# Patient Record
Sex: Female | Born: 1965 | Race: White | Hispanic: No | Marital: Married | Smoking: Never smoker
Health system: Southern US, Community
[De-identification: ages and names within clinical notes are randomized; demographics above are authoritative.]

## PROBLEM LIST (undated history)

## (undated) DIAGNOSIS — F419 Anxiety disorder, unspecified: Secondary | ICD-10-CM

## (undated) DIAGNOSIS — K649 Unspecified hemorrhoids: Secondary | ICD-10-CM

## (undated) DIAGNOSIS — G4700 Insomnia, unspecified: Secondary | ICD-10-CM

## (undated) HISTORY — DX: Insomnia, unspecified: G47.00

## (undated) HISTORY — PX: LAPAROSCOPIC GASTRIC SLEEVE RESECTION: SHX5895

## (undated) HISTORY — DX: Unspecified hemorrhoids: K64.9

## (undated) HISTORY — DX: Anxiety disorder, unspecified: F41.9

---

## 1998-04-02 ENCOUNTER — Other Ambulatory Visit: Admission: RE | Admit: 1998-04-02 | Discharge: 1998-04-02 | Payer: Self-pay | Admitting: Obstetrics and Gynecology

## 1999-04-14 ENCOUNTER — Other Ambulatory Visit: Admission: RE | Admit: 1999-04-14 | Discharge: 1999-04-14 | Payer: Self-pay | Admitting: Obstetrics and Gynecology

## 2000-04-11 ENCOUNTER — Other Ambulatory Visit: Admission: RE | Admit: 2000-04-11 | Discharge: 2000-04-11 | Payer: Self-pay | Admitting: Obstetrics and Gynecology

## 2000-05-09 ENCOUNTER — Other Ambulatory Visit: Admission: RE | Admit: 2000-05-09 | Discharge: 2000-05-09 | Payer: Self-pay | Admitting: Orthopedic Surgery

## 2000-12-02 ENCOUNTER — Ambulatory Visit (HOSPITAL_COMMUNITY): Admission: RE | Admit: 2000-12-02 | Discharge: 2000-12-02 | Payer: Self-pay | Admitting: Gastroenterology

## 2001-05-02 ENCOUNTER — Other Ambulatory Visit: Admission: RE | Admit: 2001-05-02 | Discharge: 2001-05-02 | Payer: Self-pay | Admitting: Obstetrics and Gynecology

## 2001-10-12 ENCOUNTER — Inpatient Hospital Stay (HOSPITAL_COMMUNITY): Admission: AD | Admit: 2001-10-12 | Discharge: 2001-10-12 | Payer: Self-pay | Admitting: Obstetrics and Gynecology

## 2001-12-05 ENCOUNTER — Encounter: Admission: RE | Admit: 2001-12-05 | Discharge: 2001-12-05 | Payer: Self-pay | Admitting: Obstetrics and Gynecology

## 2001-12-14 ENCOUNTER — Inpatient Hospital Stay (HOSPITAL_COMMUNITY): Admission: AD | Admit: 2001-12-14 | Discharge: 2001-12-14 | Payer: Self-pay | Admitting: Obstetrics & Gynecology

## 2001-12-18 ENCOUNTER — Encounter (HOSPITAL_COMMUNITY): Admission: RE | Admit: 2001-12-18 | Discharge: 2002-01-05 | Payer: Self-pay | Admitting: Obstetrics and Gynecology

## 2002-01-01 ENCOUNTER — Encounter: Payer: Self-pay | Admitting: Obstetrics and Gynecology

## 2002-01-03 ENCOUNTER — Inpatient Hospital Stay (HOSPITAL_COMMUNITY): Admission: AD | Admit: 2002-01-03 | Discharge: 2002-01-03 | Payer: Self-pay | Admitting: Obstetrics & Gynecology

## 2002-01-08 ENCOUNTER — Inpatient Hospital Stay (HOSPITAL_COMMUNITY): Admission: AD | Admit: 2002-01-08 | Discharge: 2002-01-11 | Payer: Self-pay | Admitting: Obstetrics & Gynecology

## 2002-02-16 ENCOUNTER — Other Ambulatory Visit: Admission: RE | Admit: 2002-02-16 | Discharge: 2002-02-16 | Payer: Self-pay | Admitting: Obstetrics and Gynecology

## 2003-03-01 ENCOUNTER — Other Ambulatory Visit: Admission: RE | Admit: 2003-03-01 | Discharge: 2003-03-01 | Payer: Self-pay | Admitting: Obstetrics and Gynecology

## 2004-03-10 ENCOUNTER — Other Ambulatory Visit: Admission: RE | Admit: 2004-03-10 | Discharge: 2004-03-10 | Payer: Self-pay | Admitting: Obstetrics and Gynecology

## 2005-05-14 ENCOUNTER — Other Ambulatory Visit: Admission: RE | Admit: 2005-05-14 | Discharge: 2005-05-14 | Payer: Self-pay | Admitting: Obstetrics and Gynecology

## 2010-01-07 ENCOUNTER — Encounter: Admission: RE | Admit: 2010-01-07 | Discharge: 2010-01-07 | Payer: Self-pay | Admitting: Obstetrics and Gynecology

## 2010-07-19 ENCOUNTER — Encounter: Payer: Self-pay | Admitting: Obstetrics and Gynecology

## 2016-05-24 ENCOUNTER — Emergency Department (HOSPITAL_COMMUNITY): Payer: BLUE CROSS/BLUE SHIELD

## 2016-05-24 ENCOUNTER — Encounter (HOSPITAL_COMMUNITY): Payer: Self-pay | Admitting: Emergency Medicine

## 2016-05-24 ENCOUNTER — Emergency Department (HOSPITAL_COMMUNITY)
Admission: EM | Admit: 2016-05-24 | Discharge: 2016-05-25 | Disposition: A | Discharge status: Home / Self Care | Payer: BLUE CROSS/BLUE SHIELD | Attending: Emergency Medicine | Admitting: Emergency Medicine

## 2016-05-24 DIAGNOSIS — Y999 Unspecified external cause status: Secondary | ICD-10-CM | POA: Insufficient documentation

## 2016-05-24 DIAGNOSIS — Y939 Activity, unspecified: Secondary | ICD-10-CM | POA: Insufficient documentation

## 2016-05-24 DIAGNOSIS — R51 Headache: Secondary | ICD-10-CM | POA: Insufficient documentation

## 2016-05-24 DIAGNOSIS — B349 Viral infection, unspecified: Secondary | ICD-10-CM | POA: Diagnosis not present

## 2016-05-24 DIAGNOSIS — S4991XA Unspecified injury of right shoulder and upper arm, initial encounter: Secondary | ICD-10-CM | POA: Insufficient documentation

## 2016-05-24 DIAGNOSIS — Y92410 Unspecified street and highway as the place of occurrence of the external cause: Secondary | ICD-10-CM | POA: Diagnosis not present

## 2016-05-24 DIAGNOSIS — R509 Fever, unspecified: Secondary | ICD-10-CM

## 2016-05-24 LAB — CBC WITH DIFFERENTIAL/PLATELET
Basophils Absolute: 0 10*3/uL (ref 0.0–0.1)
Basophils Relative: 0 %
Eosinophils Absolute: 0 10*3/uL (ref 0.0–0.7)
Eosinophils Relative: 0 %
HCT: 40.8 % (ref 36.0–46.0)
Hemoglobin: 13.8 g/dL (ref 12.0–15.0)
Lymphocytes Relative: 12 %
Lymphs Abs: 0.8 10*3/uL (ref 0.7–4.0)
MCH: 31.2 pg (ref 26.0–34.0)
MCHC: 33.8 g/dL (ref 30.0–36.0)
MCV: 92.3 fL (ref 78.0–100.0)
Monocytes Absolute: 0.4 10*3/uL (ref 0.1–1.0)
Monocytes Relative: 6 %
Neutro Abs: 5.3 10*3/uL (ref 1.7–7.7)
Neutrophils Relative %: 82 %
Platelets: 146 10*3/uL — ABNORMAL LOW (ref 150–400)
RBC: 4.42 MIL/uL (ref 3.87–5.11)
RDW: 12.6 % (ref 11.5–15.5)
WBC: 6.5 10*3/uL (ref 4.0–10.5)

## 2016-05-24 LAB — BASIC METABOLIC PANEL
Anion gap: 8 (ref 5–15)
BUN: 10 mg/dL (ref 6–20)
CO2: 22 mmol/L (ref 22–32)
Calcium: 8.5 mg/dL — ABNORMAL LOW (ref 8.9–10.3)
Chloride: 105 mmol/L (ref 101–111)
Creatinine, Ser: 0.92 mg/dL (ref 0.44–1.00)
GFR calc Af Amer: >60 mL/min (ref >60)
GFR calc non Af Amer: >60 mL/min (ref >60)
Glucose, Bld: 92 mg/dL (ref 65–99)
Potassium: 3.5 mmol/L (ref 3.5–5.1)
Sodium: 135 mmol/L (ref 135–145)

## 2016-05-24 MED ORDER — SODIUM CHLORIDE 0.9 % IV BOLUS (SEPSIS)
1000.0000 mL | Freq: Once | INTRAVENOUS | Status: AC
  Administered 2016-05-24: 1000 mL via INTRAVENOUS

## 2016-05-24 NOTE — ED Notes (Signed)
ED Provider at bedside. 
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
## 2016-05-25 ENCOUNTER — Emergency Department (HOSPITAL_COMMUNITY): Payer: BLUE CROSS/BLUE SHIELD

## 2016-05-25 DIAGNOSIS — S4991XA Unspecified injury of right shoulder and upper arm, initial encounter: Secondary | ICD-10-CM | POA: Diagnosis not present

## 2016-05-25 LAB — URINE MICROSCOPIC-ADD ON

## 2016-05-25 LAB — URINALYSIS, ROUTINE W REFLEX MICROSCOPIC
Glucose, UA: NEGATIVE mg/dL
Ketones, ur: 40 mg/dL — AB
Leukocytes, UA: NEGATIVE
Nitrite: NEGATIVE
Protein, ur: 30 mg/dL — AB
Specific Gravity, Urine: 1.026 (ref 1.005–1.030)
pH: 5.5 (ref 5.0–8.0)

## 2016-05-25 MED ORDER — HYDROCODONE-ACETAMINOPHEN 5-325 MG PO TABS: 1.0000 | ORAL_TABLET | Freq: Four times a day (QID) | ORAL | 0 refills | Status: DC | PRN

## 2016-05-25 MED ORDER — SODIUM CHLORIDE 0.9 % IV BOLUS (SEPSIS)
1000.0000 mL | Freq: Once | INTRAVENOUS | Status: AC
  Administered 2016-05-25: 1000 mL via INTRAVENOUS

## 2016-05-25 MED ORDER — GUAIFENESIN ER 1200 MG PO TB12: 1.0000 | ORAL_TABLET | Freq: Two times a day (BID) | ORAL | 0 refills | Status: DC

## 2016-05-25 MED ORDER — IBUPROFEN 800 MG PO TABS: 800.0000 mg | ORAL_TABLET | Freq: Three times a day (TID) | ORAL | 0 refills | Status: DC | PRN

## 2016-05-25 NOTE — Discharge Instructions (Signed)
Your CT scans and x-rays did not show any abnormality.  Increase her fluid intake, rest as much as possible.  Follow-up with the orthopedist provided

## 2016-05-25 NOTE — ED Notes (Signed)
Patient ambulatory to restroom  ?

## 2019-04-25 ENCOUNTER — Other Ambulatory Visit: Payer: Self-pay

## 2019-04-25 DIAGNOSIS — Z20822 Contact with and (suspected) exposure to covid-19: Secondary | ICD-10-CM

## 2019-04-27 LAB — NOVEL CORONAVIRUS, NAA: SARS-CoV-2, NAA: NOT DETECTED

## 2019-11-27 ENCOUNTER — Ambulatory Visit: Payer: BLUE CROSS/BLUE SHIELD | Admitting: Physician Assistant

## 2020-03-19 ENCOUNTER — Other Ambulatory Visit: Payer: Self-pay | Admitting: Family Medicine

## 2020-03-19 DIAGNOSIS — R519 Headache, unspecified: Secondary | ICD-10-CM

## 2020-03-20 ENCOUNTER — Other Ambulatory Visit: Payer: Self-pay | Admitting: Family Medicine

## 2020-03-20 DIAGNOSIS — R1909 Other intra-abdominal and pelvic swelling, mass and lump: Secondary | ICD-10-CM

## 2020-03-24 ENCOUNTER — Other Ambulatory Visit: Payer: Self-pay

## 2020-03-24 ENCOUNTER — Ambulatory Visit
Admission: RE | Admit: 2020-03-24 | Discharge: 2020-03-24 | Disposition: A | Discharge status: Home / Self Care | Payer: 59 | Source: Ambulatory Visit | Attending: Family Medicine | Admitting: Family Medicine

## 2020-03-24 DIAGNOSIS — R1909 Other intra-abdominal and pelvic swelling, mass and lump: Secondary | ICD-10-CM

## 2020-03-24 NOTE — Progress Notes (Signed)
Cardiology Office Note:    Date:  03/26/2020   ID:  Margaret Pruitt, DOB December 29, 1965, MRN [id number]  PCP:  Aliene Beams, MD  [facility] Cardiologist:  No primary care provider on file.  [facility] Electrophysiologist:  None   Referring MD: Aliene Beams, MD    History of Present Illness:    Margaret Pruitt is a 54 y.o. female with a hx of depression who was referred by Dr. Tracie Harrier for evaluation of palpitations.  The patient was seen by PCP on 03/18/20 for follow-up. Note reviewed. Patient has been having intermittent palpitations over the past several weeks. Last episode was about 1 week ago before she was going to bed.   Patient states she has had 3 episodes of heart fluttering. Last was 09/23 where she felt her heart was "beating out of her chest" and she had some tingling in her arms. This occurred at night when she was just relaxing with her husband. Lasted about 1.5 hrs before resolving on it's own. Had some lightheadedness and nausea during the episode. No syncope or SOB. Has had one other episode like this prior but did not last as long.   Otherwise, she feels overall okay. Has been having some fatigue, but denies any exertional chest pain or SOB. No swelling in the legs, no orthopnea or PND.  Family history: Mother has Afib. Father hypertension, DMII, high cholesterol  Labs 2017: Na 135, K 3.5, Cr 0.92, WBC 6.5, HgB 13.8  Labs at PCP 03/18/20: TSH 1.82, A1C 5.4, HgB 13.8, Cr 0.85  Past Medical History:  Diagnosis Date  . Anxiety   . Hemorrhoid   . Insomnia     Past Surgical History:  Procedure Laterality Date  . LAPAROSCOPIC GASTRIC SLEEVE RESECTION      Current Medications: Current Meds  Medication Sig  . escitalopram (LEXAPRO) 10 MG tablet Take 10 mg by mouth daily.  Marland Kitchen estradiol (ESTRACE) 2 MG tablet Take 2 mg by mouth daily.  . progesterone (PROMETRIUM) 100 MG capsule progesterone micronized 100 mg capsule  . zolpidem (AMBIEN) 10 MG tablet Take 5 mg by  mouth at bedtime as needed.      Allergies:   Patient has no known allergies.   Social History   Socioeconomic History  . Marital status: Married    Spouse name: Not on file  . Number of children: Not on file  . Years of education: Not on file  . Highest education level: Not on file  Occupational History  . Not on file  Tobacco Use  . Smoking status: Never Smoker  . Smokeless tobacco: Never Used  Substance and Sexual Activity  . Alcohol use: No  . Drug use: No  . Sexual activity: Not on file  Other Topics Concern  . Not on file  Social History Narrative  . Not on file   Social Determinants of Health   Financial Resource Strain:   . Difficulty of Paying Living Expenses: Not on file  Food Insecurity:   . Worried About Programme researcher, broadcasting/film/video in the Last Year: Not on file  . Ran Out of Food in the Last Year: Not on file  Transportation Needs:   . Lack of Transportation (Medical): Not on file  . Lack of Transportation (Non-Medical): Not on file  Physical Activity:   . Days of Exercise per Week: Not on file  . Minutes of Exercise per Session: Not on file  Stress:   . Feeling of Stress : Not on file  Social Connections:   . Frequency of Communication with Friends and Family: Not on file  . Frequency of Social Gatherings with Friends and Family: Not on file  . Attends Religious Services: Not on file  . Active Member of Clubs or Organizations: Not on file  . Attends BankerClub or Organization Meetings: Not on file  . Marital Status: Not on file     Family History: The patient's family history includes Atrial fibrillation in her mother; Colon cancer in her mother; Diabetes in her father; Hypertension in her father; Stroke in her father.  ROS:   Please see the history of present illness.    The patient denies chest pain, chest pressure, dyspnea at rest or with exertion, PND, orthopnea, or leg swelling. Denies cough, fever, chills. Denies vomiting. Denies syncope. Denies  snoring.  EKGs/Labs/Other Studies Reviewed:    The following studies were reviewed today: No cardiac studies  EKG:  EKG is  ordered today.  The ekg ordered today demonstrates NSR HR 72.  Recent Labs: No results found for requested labs within last 8760 hours.  Recent Lipid Panel No results found for: CHOL, TRIG, HDL, CHOLHDL, VLDL, LDLCALC, LDLDIRECT  Physical Exam:    VS:  BP (!) 90/52   Pulse 72   Ht 5' 3.5" (1.613 m)   Wt 147 lb (66.7 kg)   SpO2 97%   BMI 25.63 kg/m     Wt Readings from Last 3 Encounters:  03/26/20 147 lb (66.7 kg)     GEN:  Well nourished, well developed in no acute distress HEENT: Normal NECK: No JVD; No carotid bruits LYMPHATICS: No lymphadenopathy CARDIAC: RRR, no murmurs, rubs, gallops RESPIRATORY:  Clear to auscultation without rales, wheezing or rhonchi  ABDOMEN: Soft, non-tender, non-distended MUSCULOSKELETAL:  No edema; No deformity  SKIN: Warm and dry NEUROLOGIC:  Alert and oriented x 3 PSYCHIATRIC:  Normal affect   ASSESSMENT:    1. Palpitations    PLAN:    In order of problems listed above:  #Palpitations: Developed sudden onset palpitations while relaxing with her husband at night on 09/23. Unknown HR at the time. Had some associated lightheadedness and nausea. Resolved after 1.5hours after drinking cold water. Had 2 similar episodes but did not last as long. Possible SVT given abrupt onset and stopping after drinking cold drink.  -Plan for zio patch -Pending Zio patch findings, consider TTE -Will give metop 12.5mg  BID as needed for treatment of palpitations if they are to recur at home -Minimize caffeine, maintain good hydration -TSH and hemoglobin normal, electrolytes normal -Follow-up in 6 weeks to go over the results   Medication Adjustments/Labs and Tests Ordered: Current medicines are reviewed at length with the patient today.  Concerns regarding medicines are outlined above.  Orders Placed This Encounter  Procedures   . LONG TERM MONITOR (3-14 DAYS)  . EKG 12-Lead   Meds ordered this encounter  Medications  . metoprolol tartrate (LOPRESSOR) 25 MG tablet    Sig: Take 0.5 tablets (12.5 mg total) by mouth 2 (two) times daily as needed (palpitations).    Dispense:  90 tablet    Refill:  3    Patient Instructions  Medication Instructions:  Your physician has recommended you make the following change in your medication:   START: metoprolol tartrate (lopressor) 25 mg tablet: Take 1/2 tablet (12.5 mg) by mouth twice a day AS NEEDED for palpitations  *If you need a refill on your cardiac medications before your next appointment, please call your pharmacy*  Lab Work: None  If you have labs (blood work) drawn today and your tests are completely normal, you will receive your results only by: Marland Kitchen MyChart Message (if you have MyChart) OR . A paper copy in the mail If you have any lab test that is abnormal or we need to change your treatment, we will call you to review the results.   Testing/Procedures: Your physician has recommended that you wear a 14 day monitor. These monitors are medical devices that record the heart's electrical activity. Doctors most often use these monitors to diagnose arrhythmias. Arrhythmias are problems with the speed or rhythm of the heartbeat. The monitor is a small, portable device. You can wear one while you do your normal daily activities. This is usually used to diagnose what is causing palpitations/syncope (passing out).   Follow-Up: Follow up with Dr. Shari Prows on 05/06/20 at 1:20 PM   Other Instructions ZIO XT- Long Term Monitor Instructions   Your physician has requested you wear your ZIO patch monitor 14 days.   This is a single patch monitor.  [employer] supplies one patch monitor per enrollment.  Additional stickers are not available.   Please do not apply patch if you will be having a Nuclear Stress Test, Echocardiogram, Cardiac CT, MRI, or Chest Xray during the  time frame you would be wearing the monitor. The patch cannot be worn during these tests.  You cannot remove and re-apply the ZIO XT patch monitor.   Your ZIO patch monitor will be sent USPS Priority mail from [facility] directly to your home address. The monitor may also be mailed to a PO BOX if home delivery is not available.   It may take 3-5 days to receive your monitor after you have been enrolled.   Once you have received you monitor, please review enclosed instructions.  Your monitor has already been registered assigning a specific monitor serial # to you.   Applying the monitor   Shave hair from upper left chest.   Hold abrader disc by orange tab.  Rub abrader in 40 strokes over left upper chest as indicated in your monitor instructions.   Clean area with 4 enclosed alcohol pads .  Use all pads to assure are is cleaned thoroughly.  Let dry.   Apply patch as indicated in monitor instructions.  Patch will be place under collarbone on left side of chest with arrow pointing upward.   Rub patch adhesive wings for 2 minutes.Remove white label marked "1".  Remove white label marked "2".  Rub patch adhesive wings for 2 additional minutes.   While looking in a mirror, press and release button in center of patch.  A small green light will flash 3-4 times .  This will be your only indicator the monitor has been turned on.     Do not shower for the first 24 hours.  You may shower after the first 24 hours.   Press button if you feel a symptom. You will hear a small click.  Record Date, Time and Symptom in the Patient Log Book.   When you are ready to remove patch, follow instructions on last 2 pages of Patient Log Book.  Stick patch monitor onto last page of Patient Log Book.   Place Patient Log Book in [place] box.  Use locking tab on box and tape box closed securely.  The Orange and [employer] has [employer] on it.  Please place in mailbox as soon as possible.  Your  physician should  have your test results approximately 7 days after the monitor has been mailed back to [facility].   Call [facility] Customer Care at [phone] if you have questions regarding your ZIO XT patch monitor.  Call them immediately if you see an orange light blinking on your monitor.   If your monitor falls off in less than 4 days contact our Monitor department at [phone].  If your monitor becomes loose or falls off after 4 days call [employer] at [phone] for suggestions on securing your monitor.       Signed, Meriam Sprague, MD  03/26/2020 2:59 PM    [facility]

## 2020-03-26 ENCOUNTER — Other Ambulatory Visit: Payer: Self-pay

## 2020-03-26 ENCOUNTER — Ambulatory Visit (INDEPENDENT_AMBULATORY_CARE_PROVIDER_SITE_OTHER): Payer: 59 | Admitting: Cardiology

## 2020-03-26 ENCOUNTER — Encounter: Payer: Self-pay | Admitting: Cardiology

## 2020-03-26 VITALS — BP 90/52 | HR 72 | Ht 63.5 in | Wt 147.0 lb

## 2020-03-26 DIAGNOSIS — R002 Palpitations: Secondary | ICD-10-CM

## 2020-03-26 MED ORDER — METOPROLOL TARTRATE 25 MG PO TABS: 12.5000 mg | ORAL_TABLET | Freq: Two times a day (BID) | ORAL | 3 refills | Status: AC | PRN

## 2020-03-26 NOTE — Patient Instructions (Signed)
Medication Instructions:  Your physician has recommended you make the following change in your medication:   START: metoprolol tartrate (lopressor) 25 mg tablet: Take 1/2 tablet (12.5 mg) by mouth twice a day AS NEEDED for palpitations  *If you need a refill on your cardiac medications before your next appointment, please call your pharmacy*   Lab Work: None  If you have labs (blood work) drawn today and your tests are completely normal, you will receive your results only by: Marland Kitchen MyChart Message (if you have MyChart) OR . A paper copy in the mail If you have any lab test that is abnormal or we need to change your treatment, we will call you to review the results.   Testing/Procedures: Your physician has recommended that you wear a 14 day monitor. These monitors are medical devices that record the heart's electrical activity. Doctors most often use these monitors to diagnose arrhythmias. Arrhythmias are problems with the speed or rhythm of the heartbeat. The monitor is a small, portable device. You can wear one while you do your normal daily activities. This is usually used to diagnose what is causing palpitations/syncope (passing out).   Follow-Up: Follow up with Dr. Shari Prows on 05/06/20 at 1:20 PM   Other Instructions ZIO XT- Long Term Monitor Instructions   Your physician has requested you wear your ZIO patch monitor 14 days.   This is a single patch monitor.  [employer] supplies one patch monitor per enrollment.  Additional stickers are not available.   Please do not apply patch if you will be having a Nuclear Stress Test, Echocardiogram, Cardiac CT, MRI, or Chest Xray during the time frame you would be wearing the monitor. The patch cannot be worn during these tests.  You cannot remove and re-apply the ZIO XT patch monitor.   Your ZIO patch monitor will be sent USPS Priority mail from [facility] directly to your home address. The monitor may also be mailed to a PO BOX if  home delivery is not available.   It may take 3-5 days to receive your monitor after you have been enrolled.   Once you have received you monitor, please review enclosed instructions.  Your monitor has already been registered assigning a specific monitor serial # to you.   Applying the monitor   Shave hair from upper left chest.   Hold abrader disc by orange tab.  Rub abrader in 40 strokes over left upper chest as indicated in your monitor instructions.   Clean area with 4 enclosed alcohol pads .  Use all pads to assure are is cleaned thoroughly.  Let dry.   Apply patch as indicated in monitor instructions.  Patch will be place under collarbone on left side of chest with arrow pointing upward.   Rub patch adhesive wings for 2 minutes.Remove white label marked "1".  Remove white label marked "2".  Rub patch adhesive wings for 2 additional minutes.   While looking in a mirror, press and release button in center of patch.  A small green light will flash 3-4 times .  This will be your only indicator the monitor has been turned on.     Do not shower for the first 24 hours.  You may shower after the first 24 hours.   Press button if you feel a symptom. You will hear a small click.  Record Date, Time and Symptom in the Patient Log Book.   When you are ready to remove patch, follow instructions on last 2  pages of Patient Log Book.  Stick patch monitor onto last page of Patient Log Book.   Place Patient Log Book in [place] box.  Use locking tab on box and tape box closed securely.  The [employer] and [employer] has [employer] on it.  Please place in mailbox as soon as possible.  Your physician should have your test results approximately 7 days after the monitor has been mailed back to [facility].   Call [facility] Customer Care at [phone] if you have questions regarding your ZIO XT patch monitor.  Call them immediately if you see an orange light blinking on your monitor.   If your  monitor falls off in less than 4 days contact our Monitor department at [phone].  If your monitor becomes loose or falls off after 4 days call [employer] at [phone] for suggestions on securing your monitor.

## 2020-03-31 ENCOUNTER — Other Ambulatory Visit (INDEPENDENT_AMBULATORY_CARE_PROVIDER_SITE_OTHER): Payer: 59

## 2020-03-31 DIAGNOSIS — R002 Palpitations: Secondary | ICD-10-CM

## 2020-04-15 ENCOUNTER — Ambulatory Visit
Admission: RE | Admit: 2020-04-15 | Discharge: 2020-04-15 | Disposition: A | Discharge status: Home / Self Care | Payer: 59 | Source: Ambulatory Visit | Attending: Family Medicine | Admitting: Family Medicine

## 2020-04-15 DIAGNOSIS — R519 Headache, unspecified: Secondary | ICD-10-CM

## 2020-04-15 MED ORDER — GADOBENATE DIMEGLUMINE 529 MG/ML IV SOLN
13.0000 mL | Freq: Once | INTRAVENOUS | Status: AC | PRN
  Administered 2020-04-15: 13 mL via INTRAVENOUS

## 2020-04-28 ENCOUNTER — Telehealth: Payer: Self-pay | Admitting: Cardiology

## 2020-04-28 NOTE — Telephone Encounter (Signed)
Meriam Sprague, MD  04/28/2020 8:01 AM EDT     Patient's monitor showed very few episodes of SVT but these did not correlate with her triggered events. Otherwise, it was normal. We can continue the metoprolol as needed as this will help calm the palpitations down and monitor for now. If she is bothered by the extra beats and few runs of SVT, we can make it so she takes metoprolol 12.5mg  BID scheduled. It is up to her and we are happy to do what works best. Thanks so much!   The patient has been notified of the result and verbalized understanding.  All questions (if any) were answered. Patient states that she has not needed to take any metoprolol so she would like to keep it on an as needed basis. She will let us know if symptoms become bothersome.  Patient is also wondering if she needs to keep her FU appointment 11/09 since it was to discuss results.

## 2020-04-28 NOTE — Telephone Encounter (Signed)
Patient returning call for heart monitor results. 

## 2020-04-28 NOTE — Telephone Encounter (Signed)
No need to keep 11/9 appointment. She can always call and schedule if something comes up.

## 2020-04-29 NOTE — Telephone Encounter (Signed)
11/9 appointment with Dr. Shari Prows cancelled.

## 2020-04-29 NOTE — Telephone Encounter (Signed)
See MyChart messages. Appointment was cancelled.

## 2020-05-06 ENCOUNTER — Ambulatory Visit: Payer: 59 | Admitting: Cardiology

## 2021-08-29 IMAGING — MR MR HEAD WO/W CM
13 series · 48 of 48 positions shown · IV contrast (13ml multihance)
Comparison: 05/25/2016 head CT.

CLINICAL DATA: Chronic headaches.  Blurred vision.

EXAM:
MRI HEAD WITHOUT AND WITH CONTRAST
TECHNIQUE: Multiplanar, multiecho pulse sequences of the brain and surrounding
structures were obtained without and with intravenous contrast.
CONTRAST:  13mL MULTIHANCE GADOBENATE DIMEGLUMINE 529 MG/ML IV SOLN

[Series 2: T1 · sagittal · 5.0mm · 0.45mm/px · 1 of 21 slices shown]
[im 1/21]
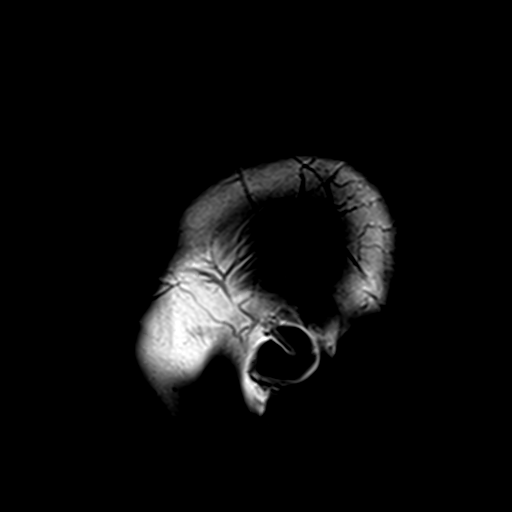

[Series 3: DWI · axial · 3.0mm · 1.80mm/px · z∈[-99,+48]mm · 7 of 99 slices shown (1 of 4)]
[im 1/99]
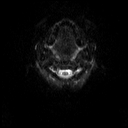
[im 17/99]
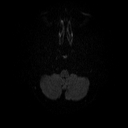
[im 33/99]
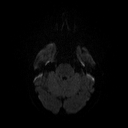
[im 50/99]
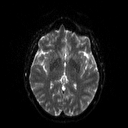
[im 66/99]
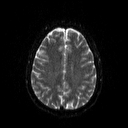
[im 82/99]
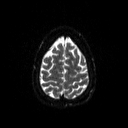
[im 99/99]
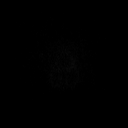

[Series 4: DWI · axial · 3.0mm · 1.80mm/px · z∈[-99,+48]mm · 3 of 50 slices shown (2 of 4)]
[im 1/50]
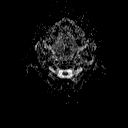
[im 25/50]
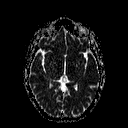
[im 50/50]
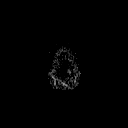

[Series 5: DWI · coronal · 5.0mm · 1.80mm/px · 4 of 66 slices shown (3 of 4)]
[im 1/66]
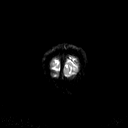
[im 22/66]
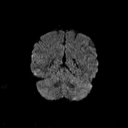
[im 44/66]
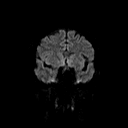
[im 66/66]
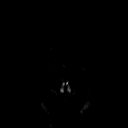

[Series 6: DWI · coronal · 5.0mm · 1.80mm/px · 2 of 34 slices shown (4 of 4)]
[im 1/34]
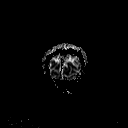
[im 34/34]
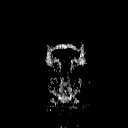

[Series 7: T2 · axial · 5.0mm · 0.72mm/px · 1 of 22 slices shown (1 of 2)]
[im 1/22]
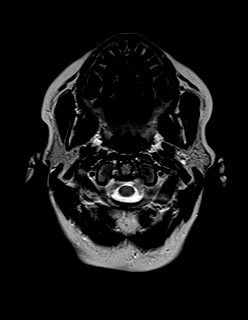

[Series 8: FLAIR · axial · 3.0mm · 0.45mm/px · z∈[-93,+42]mm · 2 of 30 slices shown (1 of 2)]
[im 1/30]
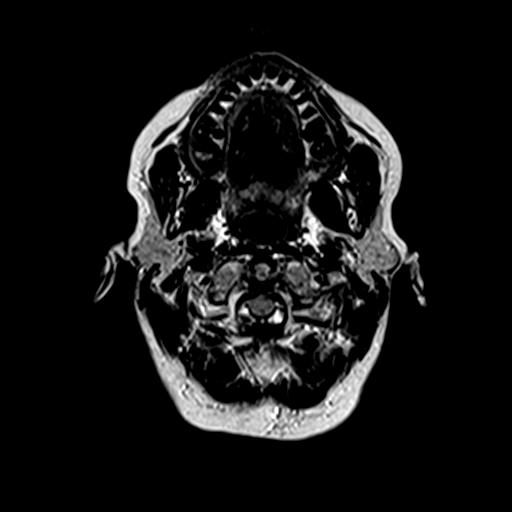
[im 30/30]
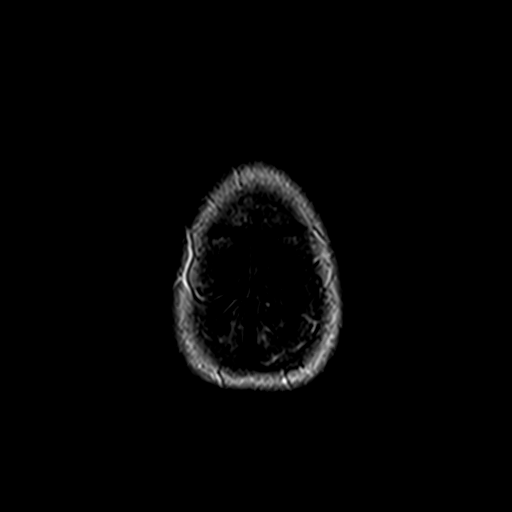

[Series 10: swi_images · axial · 4.0mm · 0.90mm/px · z∈[-96,+44]mm · 2 of 36 slices shown]
[im 1/36]
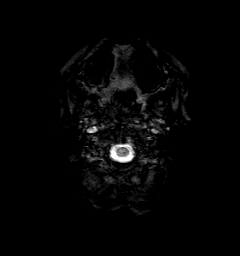
[im 36/36]
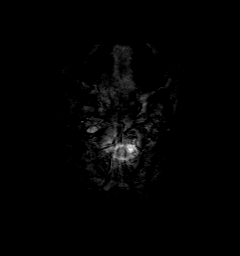

[Series 11: t1_mpr_tra · axial · 1.0mm · 0.75mm/px · z∈[-98,+45]mm · 10 of 144 slices shown (1 of 2)]
[im 1/144]
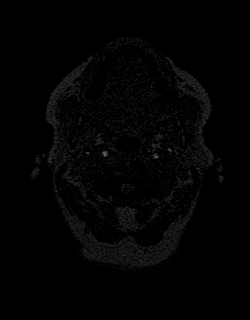
[im 16/144]
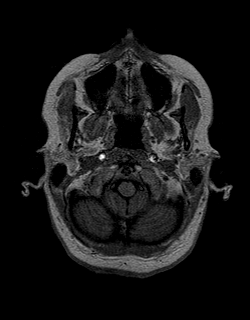
[im 32/144]
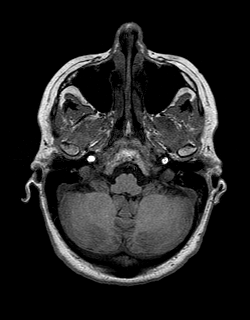
[im 48/144]
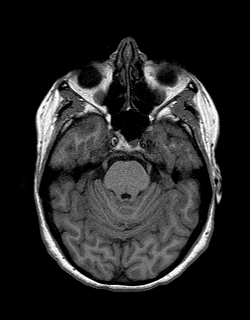
[im 64/144]
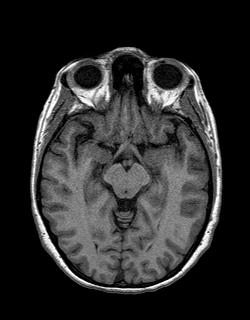
[im 80/144]
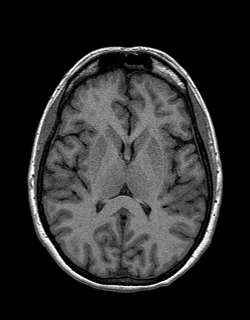
[im 96/144]
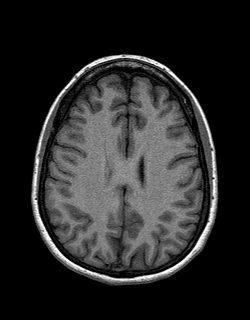
[im 112/144]
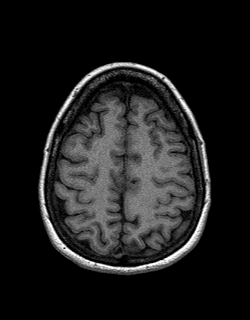
[im 128/144]
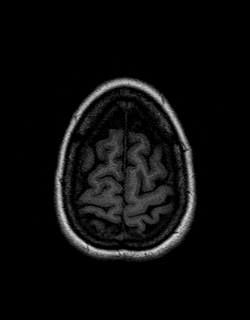
[im 144/144]
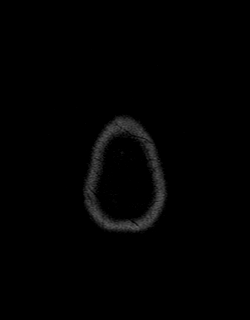

[Series 12: FLAIR · sagittal · 5.0mm · 0.45mm/px · 2 of 25 slices shown (2 of 2)]
[im 1/25]
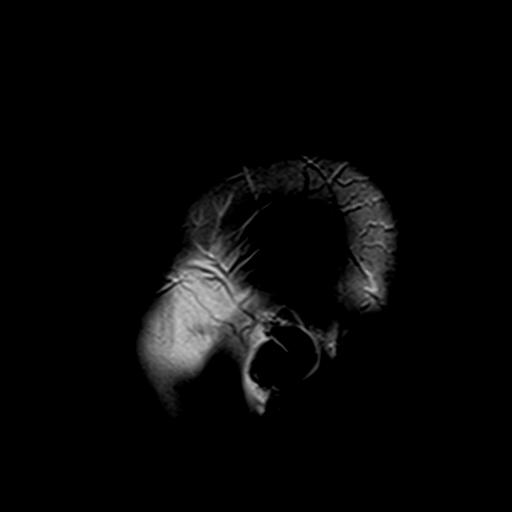
[im 25/25]
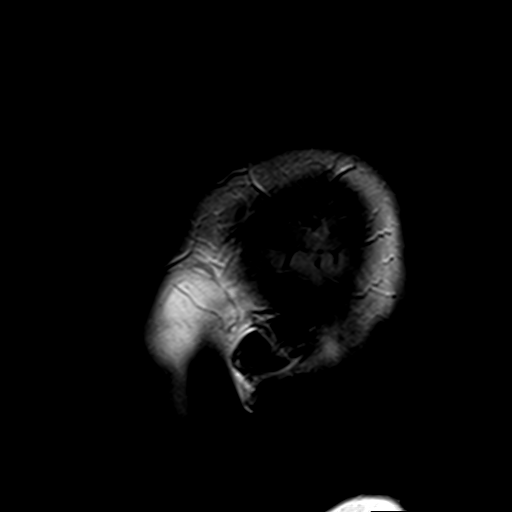

[Series 13: T2 · coronal · 5.0mm · 0.45mm/px · 2 of 25 slices shown (2 of 2)]
[im 1/25]
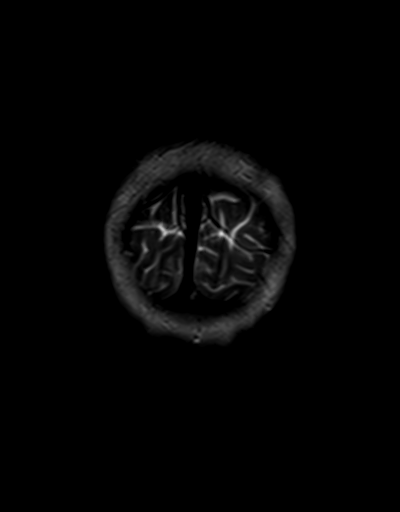
[im 25/25]
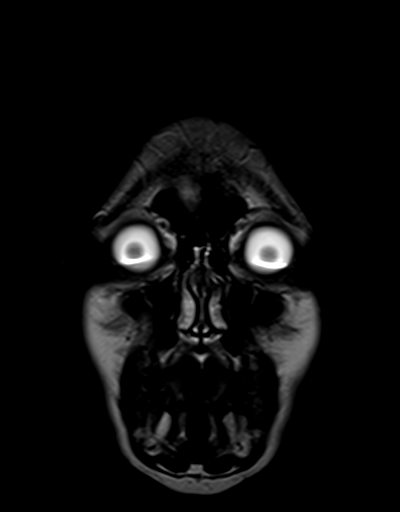

[Series 14: t1_mpr_tra · axial · 1.0mm · 0.75mm/px · z∈[-98,+45]mm · 10 of 144 slices shown (2 of 2)]
[im 1/144]
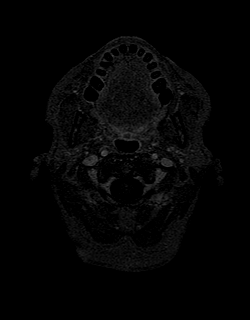
[im 16/144]
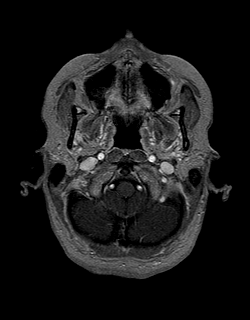
[im 32/144]
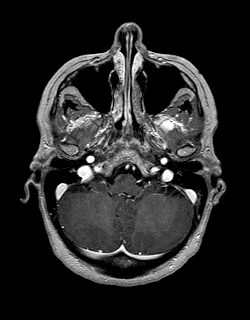
[im 48/144]
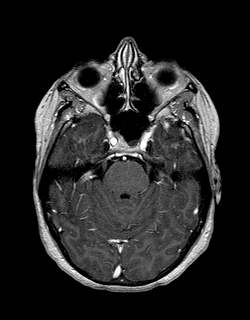
[im 64/144]
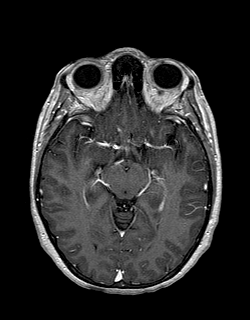
[im 80/144]
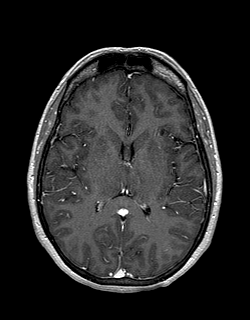
[im 96/144]
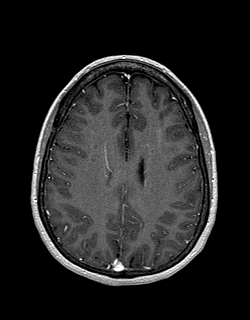
[im 112/144]
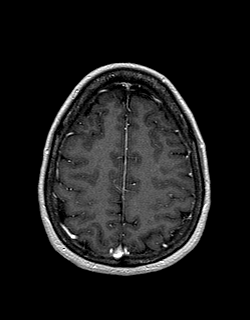
[im 128/144]
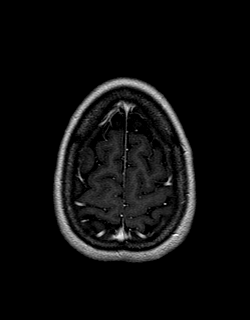
[im 144/144]
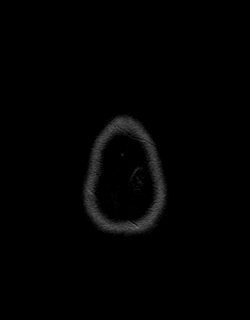

[Series 15: post cor · coronal · 5.0mm · 0.45mm/px · 2 of 25 slices shown]
[im 1/25]
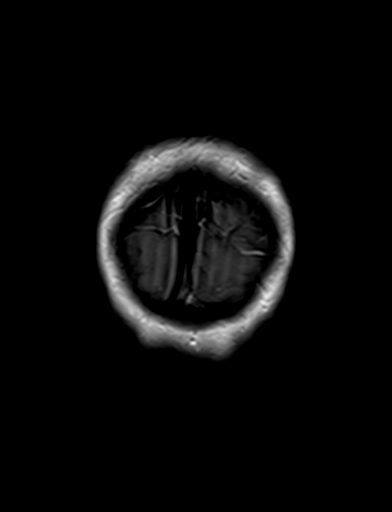
[im 25/25]
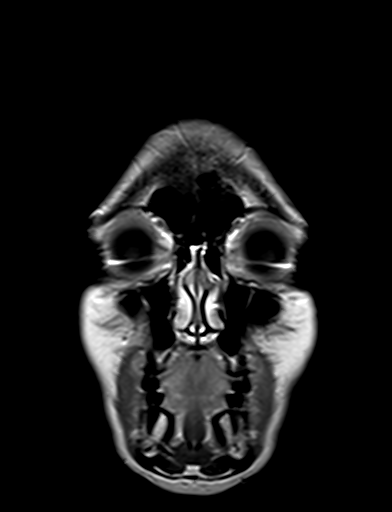

[48 of 48 positions shown; findings below may reference images not displayed]

FINDINGS: Brain: No diffusion-weighted signal abnormality. No intracranial
hemorrhage. No midline shift, ventriculomegaly or extra-axial fluid
collection. No mass lesion. No abnormal enhancement. Scattered
T2/FLAIR hyperintense foci involving the periventricular,
subcortical and infratentorial white matter. Cerebral volume is
within normal limits.

Vascular: Normal flow voids.

Skull and upper cervical spine: Normal marrow signal.

Sinuses/Orbits: Normal orbits. Clear paranasal sinuses. Trace left
mastoid effusion.

Other: None.
IMPRESSION: No acute intracranial process.

Scattered minimal white matter FLAIR hyperintensities are
nonspecific. Differential includes post infectious/inflammatory
sequela, migraines, demyelination, chronic microvascular ischemic
changes and vasculopathy.

## 2021-11-04 DIAGNOSIS — F5101 Primary insomnia: Secondary | ICD-10-CM | POA: Diagnosis not present

## 2021-11-04 DIAGNOSIS — R69 Illness, unspecified: Secondary | ICD-10-CM | POA: Diagnosis not present

## 2022-02-01 DIAGNOSIS — Z01419 Encounter for gynecological examination (general) (routine) without abnormal findings: Secondary | ICD-10-CM | POA: Diagnosis not present

## 2022-02-01 DIAGNOSIS — Z1231 Encounter for screening mammogram for malignant neoplasm of breast: Secondary | ICD-10-CM | POA: Diagnosis not present

## 2022-02-01 DIAGNOSIS — Z6828 Body mass index (BMI) 28.0-28.9, adult: Secondary | ICD-10-CM | POA: Diagnosis not present

## 2022-02-03 ENCOUNTER — Other Ambulatory Visit: Payer: Self-pay | Admitting: Obstetrics and Gynecology

## 2022-02-03 DIAGNOSIS — R928 Other abnormal and inconclusive findings on diagnostic imaging of breast: Secondary | ICD-10-CM

## 2022-02-10 ENCOUNTER — Ambulatory Visit
Admission: RE | Admit: 2022-02-10 | Discharge: 2022-02-10 | Disposition: A | Discharge status: Home / Self Care | Payer: Self-pay | Source: Ambulatory Visit | Attending: Obstetrics and Gynecology | Admitting: Obstetrics and Gynecology

## 2022-02-10 ENCOUNTER — Ambulatory Visit: Payer: Self-pay

## 2022-02-10 DIAGNOSIS — R928 Other abnormal and inconclusive findings on diagnostic imaging of breast: Secondary | ICD-10-CM

## 2022-04-01 DIAGNOSIS — E538 Deficiency of other specified B group vitamins: Secondary | ICD-10-CM | POA: Diagnosis not present

## 2022-04-01 DIAGNOSIS — Z13220 Encounter for screening for lipoid disorders: Secondary | ICD-10-CM | POA: Diagnosis not present

## 2022-04-01 DIAGNOSIS — Z0000 Encounter for general adult medical examination without abnormal findings: Secondary | ICD-10-CM | POA: Diagnosis not present

## 2022-04-01 DIAGNOSIS — Z9884 Bariatric surgery status: Secondary | ICD-10-CM | POA: Diagnosis not present

## 2022-10-01 ENCOUNTER — Ambulatory Visit
Admission: RE | Admit: 2022-10-01 | Discharge: 2022-10-01 | Disposition: A | Discharge status: Home / Self Care | Payer: 59 | Source: Ambulatory Visit | Attending: Family Medicine | Admitting: Family Medicine

## 2022-10-01 ENCOUNTER — Other Ambulatory Visit: Payer: Self-pay | Admitting: Family Medicine

## 2022-10-01 DIAGNOSIS — R0781 Pleurodynia: Secondary | ICD-10-CM

## 2022-10-01 DIAGNOSIS — F5101 Primary insomnia: Secondary | ICD-10-CM | POA: Diagnosis not present

## 2022-10-01 DIAGNOSIS — E538 Deficiency of other specified B group vitamins: Secondary | ICD-10-CM | POA: Diagnosis not present

## 2022-10-01 DIAGNOSIS — F419 Anxiety disorder, unspecified: Secondary | ICD-10-CM | POA: Diagnosis not present

## 2022-10-01 DIAGNOSIS — Z9884 Bariatric surgery status: Secondary | ICD-10-CM | POA: Diagnosis not present

## 2023-03-03 DIAGNOSIS — R319 Hematuria, unspecified: Secondary | ICD-10-CM | POA: Diagnosis not present

## 2023-03-03 DIAGNOSIS — Z6825 Body mass index (BMI) 25.0-25.9, adult: Secondary | ICD-10-CM | POA: Diagnosis not present

## 2023-03-03 DIAGNOSIS — Z1231 Encounter for screening mammogram for malignant neoplasm of breast: Secondary | ICD-10-CM | POA: Diagnosis not present

## 2023-03-03 DIAGNOSIS — Z01419 Encounter for gynecological examination (general) (routine) without abnormal findings: Secondary | ICD-10-CM | POA: Diagnosis not present

## 2023-03-31 DIAGNOSIS — R319 Hematuria, unspecified: Secondary | ICD-10-CM | POA: Diagnosis not present

## 2023-04-20 DIAGNOSIS — Z0000 Encounter for general adult medical examination without abnormal findings: Secondary | ICD-10-CM | POA: Diagnosis not present

## 2023-04-20 DIAGNOSIS — E538 Deficiency of other specified B group vitamins: Secondary | ICD-10-CM | POA: Diagnosis not present

## 2023-04-20 DIAGNOSIS — N390 Urinary tract infection, site not specified: Secondary | ICD-10-CM | POA: Diagnosis not present

## 2023-04-20 DIAGNOSIS — Z9884 Bariatric surgery status: Secondary | ICD-10-CM | POA: Diagnosis not present

## 2023-04-20 DIAGNOSIS — R3129 Other microscopic hematuria: Secondary | ICD-10-CM | POA: Diagnosis not present

## 2023-04-20 DIAGNOSIS — Z13220 Encounter for screening for lipoid disorders: Secondary | ICD-10-CM | POA: Diagnosis not present

## 2023-05-17 DIAGNOSIS — N390 Urinary tract infection, site not specified: Secondary | ICD-10-CM | POA: Diagnosis not present

## 2023-05-31 DIAGNOSIS — Z13820 Encounter for screening for osteoporosis: Secondary | ICD-10-CM | POA: Diagnosis not present

## 2023-06-14 ENCOUNTER — Other Ambulatory Visit: Payer: Self-pay | Admitting: Gastroenterology

## 2023-06-14 DIAGNOSIS — R194 Change in bowel habit: Secondary | ICD-10-CM

## 2023-06-14 DIAGNOSIS — R634 Abnormal weight loss: Secondary | ICD-10-CM

## 2023-06-14 DIAGNOSIS — Z800 Family history of malignant neoplasm of digestive organs: Secondary | ICD-10-CM | POA: Diagnosis not present

## 2023-06-27 ENCOUNTER — Ambulatory Visit
Admission: RE | Admit: 2023-06-27 | Discharge: 2023-06-27 | Disposition: A | Discharge status: Home / Self Care | Payer: 59 | Source: Ambulatory Visit | Attending: Gastroenterology | Admitting: Gastroenterology

## 2023-06-27 DIAGNOSIS — R194 Change in bowel habit: Secondary | ICD-10-CM

## 2023-06-27 DIAGNOSIS — Z9884 Bariatric surgery status: Secondary | ICD-10-CM | POA: Diagnosis not present

## 2023-06-27 DIAGNOSIS — Z800 Family history of malignant neoplasm of digestive organs: Secondary | ICD-10-CM

## 2023-06-27 DIAGNOSIS — R197 Diarrhea, unspecified: Secondary | ICD-10-CM | POA: Diagnosis not present

## 2023-06-27 DIAGNOSIS — R634 Abnormal weight loss: Secondary | ICD-10-CM

## 2023-06-27 MED ORDER — IOPAMIDOL (ISOVUE-300) INJECTION 61%
100.0000 mL | Freq: Once | INTRAVENOUS | Status: AC | PRN
  Administered 2023-06-27: 100 mL via INTRAVENOUS

## 2024-01-31 ENCOUNTER — Ambulatory Visit (INDEPENDENT_AMBULATORY_CARE_PROVIDER_SITE_OTHER): Admitting: Otolaryngology

## 2024-01-31 ENCOUNTER — Encounter (INDEPENDENT_AMBULATORY_CARE_PROVIDER_SITE_OTHER): Payer: Self-pay | Admitting: Otolaryngology

## 2024-01-31 VITALS — BP 101/67 | HR 82

## 2024-01-31 DIAGNOSIS — H6121 Impacted cerumen, right ear: Secondary | ICD-10-CM

## 2024-01-31 DIAGNOSIS — H8112 Benign paroxysmal vertigo, left ear: Secondary | ICD-10-CM

## 2024-01-31 DIAGNOSIS — R42 Dizziness and giddiness: Secondary | ICD-10-CM

## 2024-01-31 MED ORDER — MECLIZINE HCL 25 MG PO TABS: 25.0000 mg | ORAL_TABLET | Freq: Three times a day (TID) | ORAL | 2 refills | Status: AC | PRN

## 2024-02-01 DIAGNOSIS — R42 Dizziness and giddiness: Secondary | ICD-10-CM | POA: Insufficient documentation

## 2024-02-01 DIAGNOSIS — H8112 Benign paroxysmal vertigo, left ear: Secondary | ICD-10-CM | POA: Insufficient documentation

## 2024-02-01 DIAGNOSIS — H6121 Impacted cerumen, right ear: Secondary | ICD-10-CM | POA: Insufficient documentation

## 2024-02-01 NOTE — Progress Notes (Signed)
 Patient ID: Margaret Pruitt, female   DOB: Oct 29, 1965, 58 y.o.   MRN: [id number]  CC: Recurrent dizziness  HPI: The patient is a 58 year old female who presents today complaining of recurrent dizziness for the past 6 days.  She describes the dizziness as a spinning vertigo that last for several minutes.  In between episodes, she also has difficulty with her balance.  She has self treated with meclizine .  She has had multiple episodes over the past week.  She denies any otalgia, otorrhea, change in her hearing, or tinnitus.  She also denies any recent upper respiratory infection.  Exam: General: Communicates without difficulty, well nourished, no acute distress. Head: Normocephalic, no evidence injury, no tenderness, facial buttresses intact without stepoff. Face/sinus: No tenderness to palpation and percussion. Facial movement is normal and symmetric. Eyes: PERRL, EOMI. No scleral icterus, conjunctivae clear. Neuro: CN II exam reveals vision grossly intact.  No nystagmus at any point of gaze. Ears: Auricles well formed without lesions.  Right ear cerumen impaction.  The left tympanic membrane and middle ear space are normal.  Nose: External evaluation reveals normal support and skin without lesions.  Dorsum is intact.  Anterior rhinoscopy reveals congested mucosa over anterior aspect of inferior turbinates and intact septum.  No purulence noted. Oral:  Oral cavity and oropharynx are intact, symmetric, without erythema or edema.  Mucosa is moist without lesions. Neck: Full range of motion without pain.  There is no significant lymphadenopathy.  No masses palpable.  Thyroid bed within normal limits to palpation.  Parotid glands and submandibular glands equal bilaterally without mass.  Trachea is midline. Neuro:  CN 2-12 grossly intact. Vestibular: No nystagmus at any point of gaze. Vestibular: Dix-Hallpike produces rotational nystagmus with left-sided positioning. Vestibular: There is no nystagmus with pneumatic  pressure on either tympanic membrane or Valsalva. The cerebellar examination is unremarkable.   Procedure: Left-sided Epley maneuver  Anesthesia: None Indication: To treat the left BPPV Desciption:  The patient is first placed in a left-sided Dix-Hallpike position. After the vertigo has subsided, the head is gradually rotated from the left to the right, completing a 180 turn. The patient is then returned to the upright position. The patient tolerated the procedure well without any difficulty.    Procedure: Right ear cerumen disimpaction Anesthesia: None Description: Under the operating microscope, the cerumen is carefully removed with a combination of cerumen currette, alligator forceps, and suction catheters.  After the cerumen is removed, the TMs are noted to be normal.  No mass, erythema, or lesions. The patient tolerated the procedure well.    Assessment: 1.  Recurrent dizziness, secondary to left benign paroxysmal positional vertigo.  Her left Dix-Hallpike maneuver is positive today. 2.  Incidental finding of right ear cerumen impaction.  After the cerumen disimpaction procedure, her tympanic membranes and middle ear spaces are noted to be normal.  Plan: 1.  Otomicroscopy with right ear cerumen disimpaction. 2.  The pathophysiology of BPPV and dizziness are discussed extensively with the patient. Questions are invited and answered. 3.  The left Epley maneuver is performed today without difficulty. 4.  Post Epley activity restrictions are reviewed. 5.  Meclizine  refill. 6.  The patient will return for reevaluation in 4 weeks.

## 2024-03-01 ENCOUNTER — Encounter (INDEPENDENT_AMBULATORY_CARE_PROVIDER_SITE_OTHER): Payer: Self-pay | Admitting: Otolaryngology

## 2024-03-01 ENCOUNTER — Ambulatory Visit (INDEPENDENT_AMBULATORY_CARE_PROVIDER_SITE_OTHER): Admitting: Otolaryngology

## 2024-03-01 VITALS — BP 94/62 | HR 60

## 2024-03-01 DIAGNOSIS — Z8669 Personal history of other diseases of the nervous system and sense organs: Secondary | ICD-10-CM

## 2024-03-01 DIAGNOSIS — Z09 Encounter for follow-up examination after completed treatment for conditions other than malignant neoplasm: Secondary | ICD-10-CM

## 2024-03-01 DIAGNOSIS — R42 Dizziness and giddiness: Secondary | ICD-10-CM

## 2024-03-02 NOTE — Progress Notes (Signed)
 Patient ID: Margaret Pruitt, female   DOB: Dec 07, 1965, 58 y.o.   MRN: [id number]  Follow-up: Recurrent dizziness  HPI: The patient is a 58 year old female who returns today for her follow-up evaluation.  She was last seen 1 month ago.  At that time, she was complaining of recurrent dizziness.  Her left Dix-Hallpike maneuver was positive, consistent with left benign paroxysmal positional vertigo.  The patient was treated with a left Epley maneuver.  The patient returns today reporting resolution of her dizziness.  Currently she denies any otalgia, otorrhea, or vertigo.  Exam: General: Communicates without difficulty, well nourished, no acute distress. Head: Normocephalic, no evidence injury, no tenderness, facial buttresses intact without stepoff. Face/sinus: No tenderness to palpation and percussion. Facial movement is normal and symmetric. Eyes: PERRL, EOMI. No scleral icterus, conjunctivae clear. Neuro: CN II exam reveals vision grossly intact.  No nystagmus at any point of gaze. Ears: Auricles well formed without lesions.  Ear canals are intact without mass or lesion.  No erythema or edema is appreciated.  The TMs are intact without fluid. Nose: External evaluation reveals normal support and skin without lesions.  Dorsum is intact.  Anterior rhinoscopy reveals normal mucosa over anterior aspect of inferior turbinates and intact septum.  No purulence noted. Oral:  Oral cavity and oropharynx are intact, symmetric, without erythema or edema.  Mucosa is moist without lesions. Neck: Full range of motion without pain.  There is no significant lymphadenopathy.  No masses palpable.  Thyroid bed within normal limits to palpation.  Parotid glands and submandibular glands equal bilaterally without mass.  Trachea is midline. Neuro:  CN 2-12 grossly intact.   Assessment: 1.  The patient's recurrent dizziness has resolved. 2.  Her left BPPV was successfully treated with a left Epley maneuver. 3.  Her ear canals,  tympanic membranes, and middle ear spaces are all normal.  Plan: 1.  The physical exam findings are reviewed with the patient. 2.  No other intervention is needed at this time.  The pathophysiology of BPPV is discussed. 3.  The patient is encouraged to call with any questions or concerns.

## 2024-03-05 DIAGNOSIS — Z1231 Encounter for screening mammogram for malignant neoplasm of breast: Secondary | ICD-10-CM | POA: Diagnosis not present

## 2024-03-05 DIAGNOSIS — Z6821 Body mass index (BMI) 21.0-21.9, adult: Secondary | ICD-10-CM | POA: Diagnosis not present

## 2024-03-05 DIAGNOSIS — M810 Age-related osteoporosis without current pathological fracture: Secondary | ICD-10-CM | POA: Diagnosis not present

## 2024-03-05 DIAGNOSIS — Z01419 Encounter for gynecological examination (general) (routine) without abnormal findings: Secondary | ICD-10-CM | POA: Diagnosis not present

## 2024-05-18 DIAGNOSIS — Z0000 Encounter for general adult medical examination without abnormal findings: Secondary | ICD-10-CM | POA: Diagnosis not present

## 2024-05-18 DIAGNOSIS — R8290 Unspecified abnormal findings in urine: Secondary | ICD-10-CM | POA: Diagnosis not present

## 2024-05-18 DIAGNOSIS — E538 Deficiency of other specified B group vitamins: Secondary | ICD-10-CM | POA: Diagnosis not present

## 2024-05-18 DIAGNOSIS — Z13220 Encounter for screening for lipoid disorders: Secondary | ICD-10-CM | POA: Diagnosis not present

## 2024-05-18 DIAGNOSIS — Z9884 Bariatric surgery status: Secondary | ICD-10-CM | POA: Diagnosis not present

## 2024-05-29 DIAGNOSIS — M79642 Pain in left hand: Secondary | ICD-10-CM | POA: Diagnosis not present

## 2024-05-29 DIAGNOSIS — R8290 Unspecified abnormal findings in urine: Secondary | ICD-10-CM | POA: Diagnosis not present
